# Patient Record
Sex: Male | Born: 1960 | Race: White | Hispanic: No | Marital: Married | Smoking: Former smoker
Health system: Southern US, Community
[De-identification: ages and names within clinical notes are randomized; demographics above are authoritative.]

## PROBLEM LIST (undated history)

## (undated) DIAGNOSIS — I10 Essential (primary) hypertension: Secondary | ICD-10-CM

## (undated) DIAGNOSIS — E7800 Pure hypercholesterolemia, unspecified: Secondary | ICD-10-CM

## (undated) HISTORY — PX: KNEE ARTHROSCOPY: SUR90

---

## 2012-12-10 ENCOUNTER — Ambulatory Visit: Payer: Self-pay | Admitting: Unknown Physician Specialty

## 2012-12-29 LAB — PATHOLOGY REPORT

## 2013-04-20 ENCOUNTER — Emergency Department: Payer: Self-pay | Admitting: Emergency Medicine

## 2013-04-20 LAB — CBC
HGB: 15.2 g/dL (ref 13.0–18.0)
MCHC: 35.2 g/dL (ref 32.0–36.0)
MCV: 87 fL (ref 80–100)
Platelet: 233 10*3/uL (ref 150–440)
RBC: 4.95 10*6/uL (ref 4.40–5.90)
RDW: 12.8 % (ref 11.5–14.5)

## 2013-04-20 LAB — CK TOTAL AND CKMB (NOT AT ARMC)
CK, Total: 180 U/L (ref 35–232)
CK-MB: 2.8 ng/mL (ref 0.5–3.6)

## 2013-04-20 LAB — URINALYSIS, COMPLETE
Bacteria: NONE SEEN
Bilirubin,UR: NEGATIVE
Blood: NEGATIVE
Glucose,UR: NEGATIVE mg/dL (ref 0–75)
Leukocyte Esterase: NEGATIVE
RBC,UR: <1 /HPF (ref 0–5)
Specific Gravity: 1.015 (ref 1.003–1.030)
WBC UR: <1 /HPF (ref 0–5)

## 2013-04-20 LAB — COMPREHENSIVE METABOLIC PANEL
Albumin: 3.7 g/dL (ref 3.4–5.0)
Alkaline Phosphatase: 76 U/L (ref 50–136)
Anion Gap: 8 (ref 7–16)
Calcium, Total: 8.6 mg/dL (ref 8.5–10.1)
Co2: 28 mmol/L (ref 21–32)
Creatinine: 0.81 mg/dL (ref 0.60–1.30)
EGFR (African American): >60
Glucose: 107 mg/dL — ABNORMAL HIGH (ref 65–99)
SGOT(AST): 23 U/L (ref 15–37)
SGPT (ALT): 34 U/L (ref 12–78)

## 2013-05-04 ENCOUNTER — Ambulatory Visit: Payer: Self-pay | Admitting: Internal Medicine

## 2016-03-08 ENCOUNTER — Other Ambulatory Visit: Payer: Self-pay

## 2016-03-08 ENCOUNTER — Emergency Department
Admission: EM | Admit: 2016-03-08 | Discharge: 2016-03-09 | Disposition: A | Discharge status: Home / Self Care | Payer: BLUE CROSS/BLUE SHIELD | Attending: Emergency Medicine | Admitting: Emergency Medicine

## 2016-03-08 ENCOUNTER — Emergency Department: Payer: BLUE CROSS/BLUE SHIELD

## 2016-03-08 ENCOUNTER — Encounter: Payer: Self-pay | Admitting: *Deleted

## 2016-03-08 DIAGNOSIS — Z7982 Long term (current) use of aspirin: Secondary | ICD-10-CM | POA: Insufficient documentation

## 2016-03-08 DIAGNOSIS — Z87891 Personal history of nicotine dependence: Secondary | ICD-10-CM | POA: Insufficient documentation

## 2016-03-08 DIAGNOSIS — R0789 Other chest pain: Secondary | ICD-10-CM | POA: Insufficient documentation

## 2016-03-08 DIAGNOSIS — R079 Chest pain, unspecified: Secondary | ICD-10-CM

## 2016-03-08 DIAGNOSIS — Z79899 Other long term (current) drug therapy: Secondary | ICD-10-CM | POA: Insufficient documentation

## 2016-03-08 DIAGNOSIS — I10 Essential (primary) hypertension: Secondary | ICD-10-CM | POA: Diagnosis not present

## 2016-03-08 DIAGNOSIS — F419 Anxiety disorder, unspecified: Secondary | ICD-10-CM | POA: Insufficient documentation

## 2016-03-08 HISTORY — DX: Essential (primary) hypertension: I10

## 2016-03-08 HISTORY — DX: Pure hypercholesterolemia, unspecified: E78.00

## 2016-03-08 LAB — BASIC METABOLIC PANEL
ANION GAP: 9 (ref 5–15)
BUN: 10 mg/dL (ref 6–20)
CALCIUM: 9.2 mg/dL (ref 8.9–10.3)
CO2: 27 mmol/L (ref 22–32)
Chloride: 104 mmol/L (ref 101–111)
Creatinine, Ser: 0.81 mg/dL (ref 0.61–1.24)
GLUCOSE: 125 mg/dL — AB (ref 65–99)
POTASSIUM: 4 mmol/L (ref 3.5–5.1)
SODIUM: 140 mmol/L (ref 135–145)

## 2016-03-08 LAB — CBC
HEMATOCRIT: 42.2 % (ref 40.0–52.0)
HEMOGLOBIN: 14.7 g/dL (ref 13.0–18.0)
MCH: 30.2 pg (ref 26.0–34.0)
MCHC: 34.9 g/dL (ref 32.0–36.0)
MCV: 86.5 fL (ref 80.0–100.0)
Platelets: 201 10*3/uL (ref 150–440)
RBC: 4.88 MIL/uL (ref 4.40–5.90)
RDW: 13 % (ref 11.5–14.5)
WBC: 10.8 10*3/uL — AB (ref 3.8–10.6)

## 2016-03-08 LAB — TROPONIN I

## 2016-03-08 NOTE — ED Provider Notes (Signed)
[facility] Emergency Department Provider Note   ____________________________________________  Time seen: Approximately 11:20 PM  I have reviewed the triage vital signs and the nursing notes.   HISTORY  Chief Complaint Chest Pain    HPI Alexander Burke is a 55 y.o. male who presents to the ED from home with multiple medical complaints. Patient reports a 3 week history of intermittent lightheadedness, chest tightness, shortness of breath, nausea, back and shoulder pain.Reports waking up in the middle the night with a sensation of overwhelming anxiety which is relieved by taking ibuprofen so he can go back to sleep. Patient reports lightheadedness for most of the day today. Began with chest tightness approximately 7 PM. States chest tightness has lessened without intervention and is currently almost gone. Symptoms associated with palpitations. Denies associated symptoms of fever, chills, cough, congestion, vomiting, dysuria, diarrhea. Denies recent travel or trauma. Does state he has been taking an over-the-counter testosterone supplement for the past several months. Nothing makes his symptoms better or worse.   Past Medical History  Diagnosis Date  . Hypertension   . Hypercholesterolemia     There are no active problems to display for this patient.   Past Surgical History  Procedure Laterality Date  . Knee arthroscopy Bilateral     Current Outpatient Rx  Name  Route  Sig  Dispense  Refill  . aspirin EC 81 MG tablet   Oral   Take 81 mg by mouth daily.         . propranolol (INDERAL) 20 MG tablet   Oral   Take 20 mg by mouth 2 (two) times daily.         . simvastatin (ZOCOR) 20 MG tablet   Oral   Take 20 mg by mouth every evening.         Marland Kitchen. LORazepam (ATIVAN) 1 MG tablet   Oral   Take 1 tablet (1 mg total) by mouth every 8 (eight) hours as needed for anxiety.   15 tablet   0     Allergies Review of patient's allergies indicates no  known allergies.  Family History None for CAD  Social History Social History  Substance Use Topics  . Smoking status: Former Smoker    Types: Cigarettes  . Smokeless tobacco: Never Used  . Alcohol Use: No    Review of Systems  Constitutional: No fever/chills. Eyes: No visual changes. ENT: No sore throat. Cardiovascular: Positive for chest pain. Respiratory: Positive for shortness of breath. Gastrointestinal: No abdominal pain.  Positive for nausea, no vomiting.  No diarrhea.  No constipation. Genitourinary: Negative for dysuria. Musculoskeletal: Negative for back pain. Skin: Negative for rash. Neurological: Negative for headaches, focal weakness or numbness.  10-point ROS otherwise negative.  ____________________________________________   PHYSICAL EXAM:  VITAL SIGNS: ED Triage Vitals  Enc Vitals Group     BP 03/08/16 2233 130/85 mmHg     Pulse Rate 03/08/16 2233 70     Resp 03/08/16 2233 20     Temp 03/08/16 2233 98.3 F (36.8 C)     Temp Source 03/08/16 2233 Oral     SpO2 03/08/16 2233 97 %     Weight 03/08/16 2233 200 lb (90.719 kg)     Height 03/08/16 2233 5\' 7"  (1.702 m)     Head Cir --      Peak Flow --      Pain Score 03/08/16 2230 7     Pain Loc --  Pain Edu? --      Excl. in GC? --     Constitutional: Alert and oriented. Well appearing and in no acute distress. Eyes: Conjunctivae are normal. PERRL. EOMI. Head: Atraumatic. Nose: No congestion/rhinnorhea. Mouth/Throat: Mucous membranes are moist.  Oropharynx non-erythematous. Neck: No stridor.  No carotid bruits. Cardiovascular: Normal rate, regular rhythm. Grossly normal heart sounds.  Good peripheral circulation. Respiratory: Normal respiratory effort.  No retractions. Lungs CTAB. Gastrointestinal: Soft and nontender. No distention. No abdominal bruits. No CVA tenderness. Musculoskeletal: No lower extremity tenderness nor edema.  No joint effusions. Neurologic:  Normal speech and language.  No gross focal neurologic deficits are appreciated. No gait instability. Skin:  Skin is warm, dry and intact. No rash noted. Psychiatric: Mood and affect are normal. Speech and behavior are normal.  ____________________________________________   LABS (all labs ordered are listed, but only abnormal results are displayed)  Labs Reviewed  BASIC METABOLIC PANEL - Abnormal; Notable for the following:    Glucose, Bld 125 (*)    All other components within normal limits  CBC - Abnormal; Notable for the following:    WBC 10.8 (*)    All other components within normal limits  HEPATIC FUNCTION PANEL - Abnormal; Notable for the following:    Bilirubin, Direct <0.1 (*)    All other components within normal limits  BLOOD GAS, ARTERIAL - Abnormal; Notable for the following:    pO2, Arterial 79 (*)    Bicarbonate 29.8 (*)    Acid-Base Excess 4.4 (*)    All other components within normal limits  TROPONIN I  LIPASE, BLOOD  TROPONIN I  FIBRIN DERIVATIVES D-DIMER ([facility] ONLY)  CARBOXYHEMOGLOBIN   ____________________________________________  EKG  ED ECG REPORT I, Alexandrya Chim J, the attending physician, personally viewed and interpreted this ECG.   Date: 03/09/2016  EKG Time: 2228  Rate: 76  Rhythm: normal EKG, normal sinus rhythm  Axis: Normal  Intervals:none  ST&T Change: Nonspecific  ____________________________________________  RADIOLOGY  Chest 2 view (viewed by me, interpreted per Dr. Manus Gunning): No active cardiopulmonary disease. ____________________________________________   PROCEDURES  Procedure(s) performed: None  Critical Care performed: No  ____________________________________________   INITIAL IMPRESSION / ASSESSMENT AND PLAN / ED COURSE  Pertinent labs & imaging results that were available during my care of the patient were reviewed by me and considered in my medical decision making (see chart for details).  55 year old male who presents with a several week  history of dizziness, chest tightness and anxiety. Patient relates he had similar symptoms for several months back in 2005 with negative cardiology workup including negative stress test. Will repeat time troponin, d-dimer, administer aspirin and reassess.  ----------------------------------------- 2:06 AM on 03/09/2016 -----------------------------------------  Patient resting comfortably; voices no complaints at this time. Updated patient and spouse of negative repeat troponin and d-dimer. Co-oximetry also within normal limits. Feel that with patient's symptoms for several weeks with 2 negative troponins and otherwise low risk for ACS, he would be safe for discharge home with outpatient cardiology follow-up. Will prescribe low-dose anxiolytic for anxiety. Strict return precautions given. Both verbalize understanding and agree with plan of care. ____________________________________________   FINAL CLINICAL IMPRESSION(S) / ED DIAGNOSES  Final diagnoses:  Chest pain, unspecified chest pain type  Anxiety      NEW MEDICATIONS STARTED DURING THIS VISIT:  Discharge Medication List as of 03/09/2016  2:09 AM    START taking these medications   Details  LORazepam (ATIVAN) 1 MG tablet Take 1 tablet (1 mg total)  by mouth every 8 (eight) hours as needed for anxiety., Starting 03/09/2016, Until Discontinued, Print         Note:  This document was prepared using Dragon voice recognition software and may include unintentional dictation errors.    Irean Hong, MD 03/09/16 [phone]

## 2016-03-08 NOTE — ED Notes (Addendum)
Pt c/o intermittent chest pain w/ associated sxs of nausea, back and shoulder pain, diaphoresis, dizziness and shortness of breath for past 3 weeks. Pt states pain/tightness got bad x 2 hrs ago and has persisted w/o relief. Pt states pain has been worse, but resolved. Pt has taken 81 mg ASA daily. Pt states he is waking in middle of night over past 3 weeks w/ sensation of overwhelming anxiety

## 2016-03-09 LAB — BLOOD GAS, ARTERIAL
Acid-Base Excess: 4.4 mmol/L — ABNORMAL HIGH (ref 0.0–3.0)
BICARBONATE: 29.8 meq/L — AB (ref 21.0–28.0)
FIO2: 0.21
O2 Saturation: 95.8 %
PATIENT TEMPERATURE: 37
PO2 ART: 79 mmHg — AB (ref 83.0–108.0)
pCO2 arterial: 46 mmHg (ref 32.0–48.0)
pH, Arterial: 7.42 (ref 7.350–7.450)

## 2016-03-09 LAB — HEPATIC FUNCTION PANEL
ALK PHOS: 59 U/L (ref 38–126)
ALT: 31 U/L (ref 17–63)
AST: 33 U/L (ref 15–41)
Albumin: 4.1 g/dL (ref 3.5–5.0)
Bilirubin, Direct: <0.1 mg/dL — ABNORMAL LOW (ref 0.1–0.5)
TOTAL PROTEIN: 6.8 g/dL (ref 6.5–8.1)
Total Bilirubin: 0.4 mg/dL (ref 0.3–1.2)

## 2016-03-09 LAB — CARBOXYHEMOGLOBIN
Carboxyhemoglobin: 1.6 % (ref 1.5–9.0)
Methemoglobin: 1.2 %
O2 Saturation: 96.2 %
TOTAL OXYGEN CONTENT: 95.4 mL/dL

## 2016-03-09 LAB — TROPONIN I: Troponin I: <0.03 ng/mL (ref <0.031)

## 2016-03-09 LAB — LIPASE, BLOOD: LIPASE: 22 U/L (ref 11–51)

## 2016-03-09 LAB — FIBRIN DERIVATIVES D-DIMER (ARMC ONLY): FIBRIN DERIVATIVES D-DIMER (ARMC): 261 (ref 0–499)

## 2016-03-09 MED ORDER — ASPIRIN 81 MG PO CHEW
324.0000 mg | CHEWABLE_TABLET | Freq: Once | ORAL | Status: AC
  Administered 2016-03-09: 324 mg via ORAL
  Filled 2016-03-09: qty 4

## 2016-03-09 MED ORDER — LORAZEPAM 1 MG PO TABS: 1.0000 mg | ORAL_TABLET | Freq: Three times a day (TID) | ORAL | Status: DC | PRN

## 2016-03-09 NOTE — ED Notes (Signed)
Pt updated on plan of care. Pt verbalizes understanding.  

## 2016-03-09 NOTE — Discharge Instructions (Signed)
1. You may take anxiety medicine at night as needed (Ativan #15). 2. Return to the ER for recurrent or worsening symptoms, persistent vomiting, difficult breathing or other concerns.  Nonspecific Chest Pain  Chest pain can be caused by many different conditions. There is always a chance that your pain could be related to something serious, such as a heart attack or a blood clot in your lungs. Chest pain can also be caused by conditions that are not life-threatening. If you have chest pain, it is very important to follow up with your health care provider. CAUSES  Chest pain can be caused by:  Heartburn.  Pneumonia or bronchitis.  Anxiety or stress.  Inflammation around your heart (pericarditis) or lung (pleuritis or pleurisy).  A blood clot in your lung.  A collapsed lung (pneumothorax). It can develop suddenly on its own (spontaneous pneumothorax) or from trauma to the chest.  Shingles infection (varicella-zoster virus).  Heart attack.  Damage to the bones, muscles, and cartilage that make up your chest wall. This can include:  Bruised bones due to injury.  Strained muscles or cartilage due to frequent or repeated coughing or overwork.  Fracture to one or more ribs.  Sore cartilage due to inflammation (costochondritis). RISK FACTORS  Risk factors for chest pain may include:  Activities that increase your risk for trauma or injury to your chest.  Respiratory infections or conditions that cause frequent coughing.  Medical conditions or overeating that can cause heartburn.  Heart disease or family history of heart disease.  Conditions or health behaviors that increase your risk of developing a blood clot.  Having had chicken pox (varicella zoster). SIGNS AND SYMPTOMS Chest pain can feel like:  Burning or tingling on the surface of your chest or deep in your chest.  Crushing, pressure, aching, or squeezing pain.  Dull or sharp pain that is worse when you move, cough,  or take a deep breath.  Pain that is also felt in your back, neck, shoulder, or arm, or pain that spreads to any of these areas. Your chest pain may come and go, or it may stay constant. DIAGNOSIS Lab tests or other studies may be needed to find the cause of your pain. Your health care provider may have you take a test called an ambulatory ECG (electrocardiogram). An ECG records your heartbeat patterns at the time the test is performed. You may also have other tests, such as:  Transthoracic echocardiogram (TTE). During echocardiography, sound waves are used to create a picture of all of the heart structures and to look at how blood flows through your heart.  Transesophageal echocardiogram (TEE).This is a more advanced imaging test that obtains images from inside your body. It allows your health care provider to see your heart in finer detail.  Cardiac monitoring. This allows your health care provider to monitor your heart rate and rhythm in real time.  Holter monitor. This is a portable device that records your heartbeat and can help to diagnose abnormal heartbeats. It allows your health care provider to track your heart activity for several days, if needed.  Stress tests. These can be done through exercise or by taking medicine that makes your heart beat more quickly.  Blood tests.  Imaging tests. TREATMENT  Your treatment depends on what is causing your chest pain. Treatment may include:  Medicines. These may include:  Acid blockers for heartburn.  Anti-inflammatory medicine.  Pain medicine for inflammatory conditions.  Antibiotic medicine, if an infection is present.  Medicines to dissolve blood clots.  Medicines to treat coronary artery disease.  Supportive care for conditions that do not require medicines. This may include:  Resting.  Applying heat or cold packs to injured areas.  Limiting activities until pain decreases. HOME CARE INSTRUCTIONS  If you were  prescribed an antibiotic medicine, finish it all even if you start to feel better.  Avoid any activities that bring on chest pain.  Do not use any tobacco products, including cigarettes, chewing tobacco, or electronic cigarettes. If you need help quitting, ask your health care provider.  Do not drink alcohol.  Take medicines only as directed by your health care provider.  Keep all follow-up visits as directed by your health care provider. This is important. This includes any further testing if your chest pain does not go away.  If heartburn is the cause for your chest pain, you may be told to keep your head raised (elevated) while sleeping. This reduces the chance that acid will go from your stomach into your esophagus.  Make lifestyle changes as directed by your health care provider. These may include:  Getting regular exercise. Ask your health care provider to suggest some activities that are safe for you.  Eating a heart-healthy diet. A registered dietitian can help you to learn healthy eating options.  Maintaining a healthy weight.  Managing diabetes, if necessary.  Reducing stress. SEEK MEDICAL CARE IF:  Your chest pain does not go away after treatment.  You have a rash with blisters on your chest.  You have a fever. SEEK IMMEDIATE MEDICAL CARE IF:   Your chest pain is worse.  You have an increasing cough, or you cough up blood.  You have severe abdominal pain.  You have severe weakness.  You faint.  You have chills.  You have sudden, unexplained chest discomfort.  You have sudden, unexplained discomfort in your arms, back, neck, or jaw.  You have shortness of breath at any time.  You suddenly start to sweat, or your skin gets clammy.  You feel nauseous or you vomit.  You suddenly feel light-headed or dizzy.  Your heart begins to beat quickly, or it feels like it is skipping beats. These symptoms may represent a serious problem that is an emergency. Do  not wait to see if the symptoms will go away. Get medical help right away. Call your local emergency services (911 in the [place] Do not drive yourself to the hospital.   This information is not intended to replace advice given to you by your health care provider. Make sure you discuss any questions you have with your health care provider.   Document Released: 06/18/2005 Document Revised: 09/29/2014 Document Reviewed: 04/14/2014 Elsevier Interactive Patient Education 2016 [employer]  Generalized Anxiety Disorder Generalized anxiety disorder (GAD) is a mental disorder. It interferes with life functions, including relationships, work, and school. GAD is different from normal anxiety, which everyone experiences at some point in their lives in response to specific life events and activities. Normal anxiety actually helps us prepare for and get through these life events and activities. Normal anxiety goes away after the event or activity is over.  GAD causes anxiety that is not necessarily related to specific events or activities. It also causes excess anxiety in proportion to specific events or activities. The anxiety associated with GAD is also difficult to control. GAD can vary from mild to severe. People with severe GAD can have intense waves of anxiety with physical symptoms (panic attacks).  SYMPTOMS The anxiety and worry associated with GAD are difficult to control. This anxiety and worry are related to many life events and activities and also occur more days than not for 6 months or longer. People with GAD also have three or more of the following symptoms (one or more in children):  Restlessness.   Fatigue.  Difficulty concentrating.   Irritability.  Muscle tension.  Difficulty sleeping or unsatisfying sleep. DIAGNOSIS GAD is diagnosed through an assessment by your health care provider. Your health care provider will ask you questions aboutyour mood,physical symptoms, and events  in your life. Your health care provider may ask you about your medical history and use of alcohol or drugs, including prescription medicines. Your health care provider may also do a physical exam and blood tests. Certain medical conditions and the use of certain substances can cause symptoms similar to those associated with GAD. Your health care provider may refer you to a mental health specialist for further evaluation. TREATMENT The following therapies are usually used to treat GAD:   Medication. Antidepressant medication usually is prescribed for long-term daily control. Antianxiety medicines may be added in severe cases, especially when panic attacks occur.   Talk therapy (psychotherapy). Certain types of talk therapy can be helpful in treating GAD by providing support, education, and guidance. A form of talk therapy called cognitive behavioral therapy can teach you healthy ways to think about and react to daily life events and activities.  Stress managementtechniques. These include yoga, meditation, and exercise and can be very helpful when they are practiced regularly. A mental health specialist can help determine which treatment is best for you. Some people see improvement with one therapy. However, other people require a combination of therapies.   This information is not intended to replace advice given to you by your health care provider. Make sure you discuss any questions you have with your health care provider.   Document Released: 01/03/2013 Document Revised: 09/29/2014 Document Reviewed: 01/03/2013 Elsevier Interactive Patient Education [employer]

## 2016-03-09 NOTE — ED Notes (Signed)

## 2017-11-24 IMAGING — CR DG CHEST 2V
1 series · 2 of 2 positions shown · non-contrast
Comparison: 04/20/2013

CLINICAL DATA: Intermittent chest pain with nausea, back and
shoulder pain, diaphoresis, dizziness and shortness of breath for 3
weeks. Currently pain for 2 hours.

EXAM:
CHEST  2 VIEW

[Series 1: dg chest 2 view · 0.14mm/px · 2 of 2 slices shown]
[im 1/2]
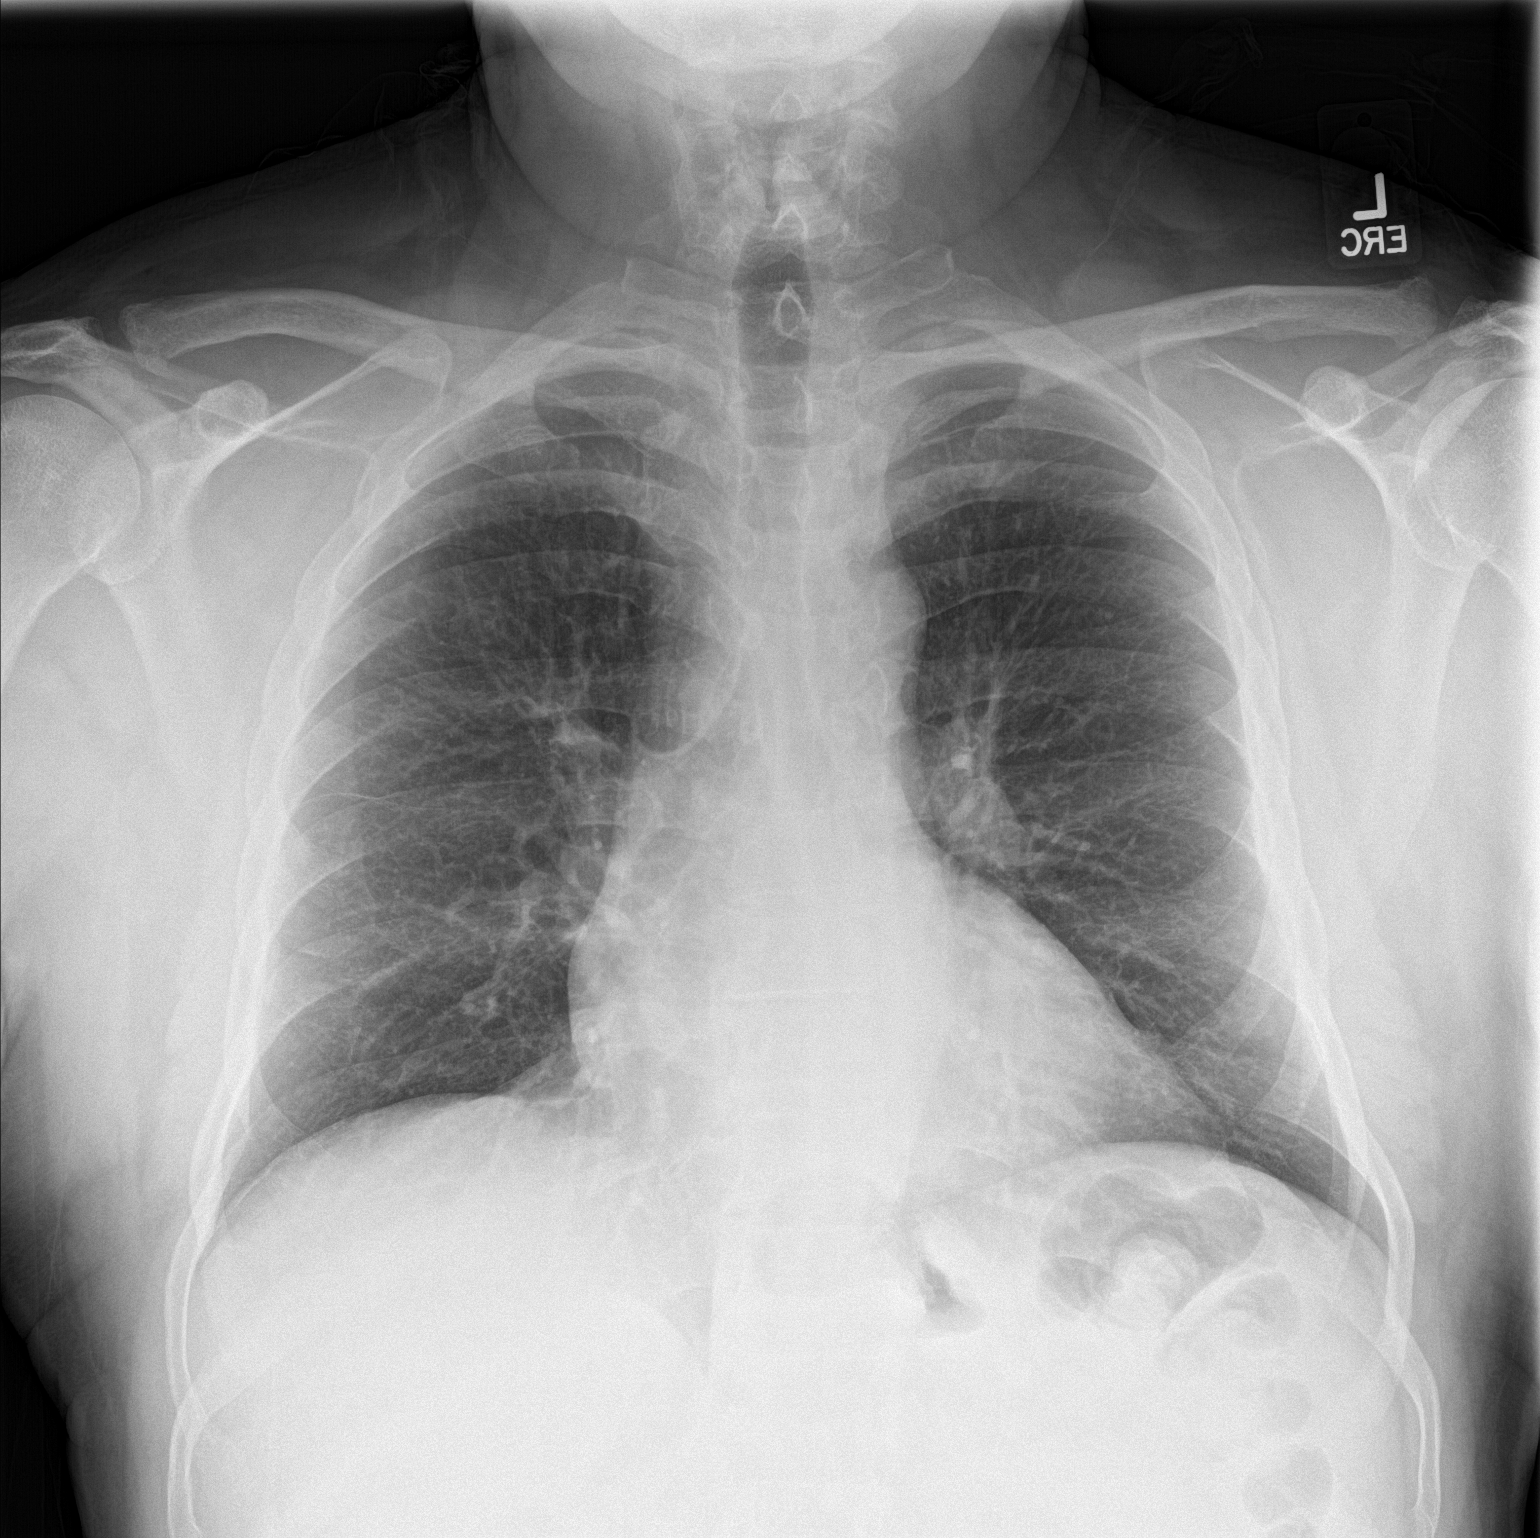
[im 2/2]
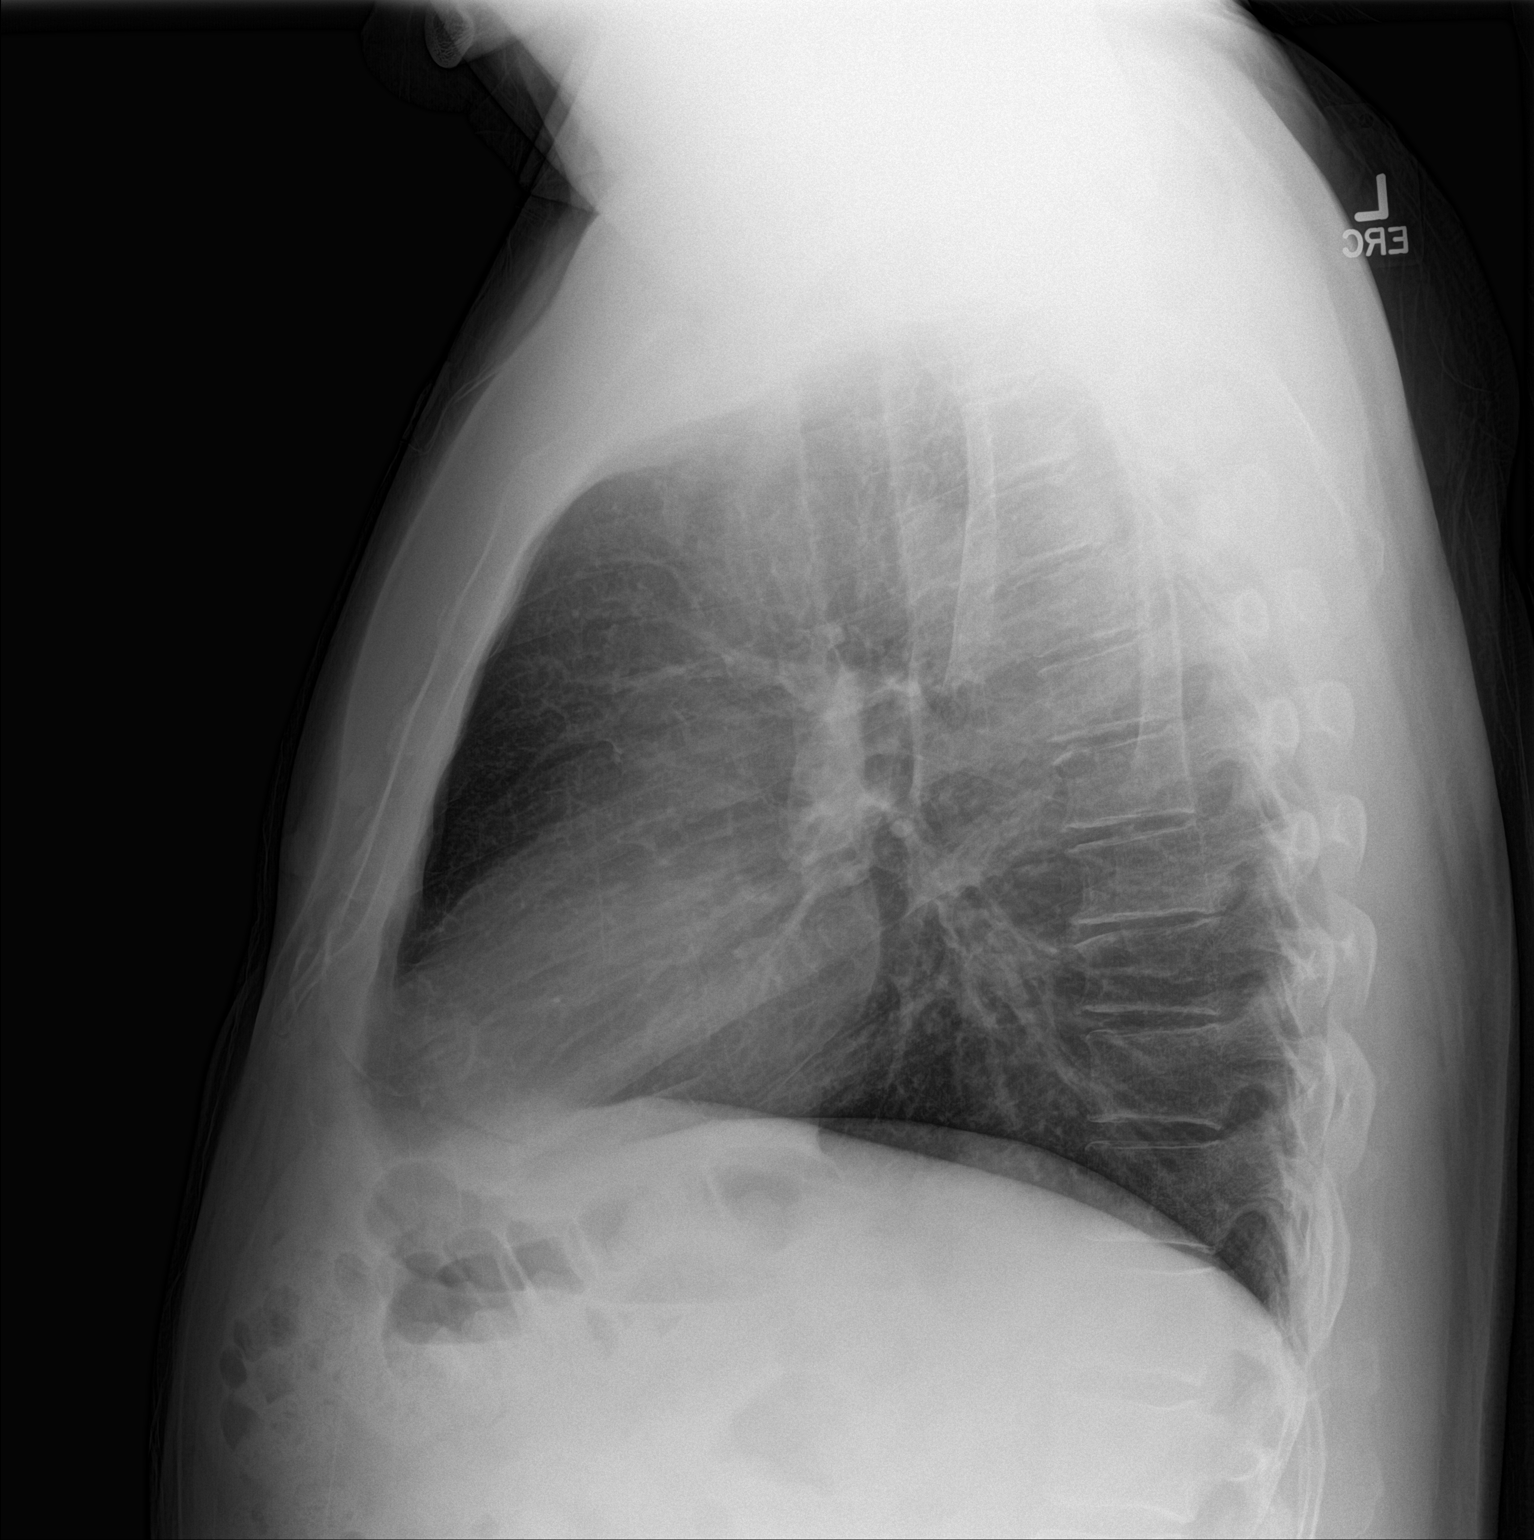

[2 of 2 positions shown; findings below may reference images not displayed]

FINDINGS: The cardiomediastinal contours are normal. The lungs are clear.
Pulmonary vasculature is normal. No consolidation, pleural effusion,
or pneumothorax. No acute osseous abnormalities are seen.
IMPRESSION: No active cardiopulmonary disease.

## 2024-01-18 ENCOUNTER — Ambulatory Visit: Payer: Self-pay

## 2024-01-18 DIAGNOSIS — D123 Benign neoplasm of transverse colon: Secondary | ICD-10-CM | POA: Diagnosis not present

## 2024-01-18 DIAGNOSIS — D122 Benign neoplasm of ascending colon: Secondary | ICD-10-CM | POA: Diagnosis not present

## 2024-01-18 DIAGNOSIS — K5730 Diverticulosis of large intestine without perforation or abscess without bleeding: Secondary | ICD-10-CM | POA: Diagnosis not present

## 2024-01-18 DIAGNOSIS — D120 Benign neoplasm of cecum: Secondary | ICD-10-CM | POA: Diagnosis not present

## 2024-01-18 DIAGNOSIS — K640 First degree hemorrhoids: Secondary | ICD-10-CM | POA: Diagnosis not present

## 2024-01-18 DIAGNOSIS — Z1211 Encounter for screening for malignant neoplasm of colon: Secondary | ICD-10-CM | POA: Diagnosis present

## 2024-10-28 ENCOUNTER — Other Ambulatory Visit: Payer: Self-pay | Admitting: Internal Medicine

## 2024-10-28 DIAGNOSIS — I350 Nonrheumatic aortic (valve) stenosis: Secondary | ICD-10-CM

## 2024-11-01 ENCOUNTER — Ambulatory Visit

## 2024-11-02 ENCOUNTER — Ambulatory Visit: Admission: RE | Admit: 2024-11-02 | Source: Home / Self Care | Admitting: Internal Medicine

## 2024-11-02 ENCOUNTER — Encounter: Admission: RE | Payer: Self-pay | Source: Home / Self Care

## 2024-11-02 DIAGNOSIS — I350 Nonrheumatic aortic (valve) stenosis: Secondary | ICD-10-CM
